# Patient Record
Sex: Male | Born: 1995 | Race: Black or African American | Hispanic: No | Marital: Single | Smoking: Current some day smoker
Health system: Southern US, Community
[De-identification: ages and names within clinical notes are randomized; demographics above are authoritative.]

## PROBLEM LIST (undated history)

## (undated) DIAGNOSIS — J45909 Unspecified asthma, uncomplicated: Secondary | ICD-10-CM

---
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
## 2022-11-16 ENCOUNTER — Other Ambulatory Visit: Payer: Self-pay

## 2022-11-16 ENCOUNTER — Emergency Department (HOSPITAL_BASED_OUTPATIENT_CLINIC_OR_DEPARTMENT_OTHER)
Admission: EM | Admit: 2022-11-16 | Discharge: 2022-11-16 | Disposition: A | Discharge status: Home / Self Care | Payer: BLUE CROSS/BLUE SHIELD | Attending: Emergency Medicine | Admitting: Emergency Medicine

## 2022-11-16 ENCOUNTER — Encounter (HOSPITAL_BASED_OUTPATIENT_CLINIC_OR_DEPARTMENT_OTHER): Payer: Self-pay

## 2022-11-16 DIAGNOSIS — L02212 Cutaneous abscess of back [any part, except buttock]: Secondary | ICD-10-CM | POA: Insufficient documentation

## 2022-11-16 DIAGNOSIS — J45909 Unspecified asthma, uncomplicated: Secondary | ICD-10-CM | POA: Insufficient documentation

## 2022-11-16 DIAGNOSIS — Z7982 Long term (current) use of aspirin: Secondary | ICD-10-CM | POA: Insufficient documentation

## 2022-11-16 DIAGNOSIS — L0291 Cutaneous abscess, unspecified: Secondary | ICD-10-CM

## 2022-11-16 MED ORDER — NAPROXEN 500 MG PO TABS: 500.0000 mg | ORAL_TABLET | Freq: Two times a day (BID) | ORAL | 0 refills | Status: AC

## 2022-11-16 MED ORDER — DOXYCYCLINE HYCLATE 100 MG PO CAPS: 100.0000 mg | ORAL_CAPSULE | Freq: Two times a day (BID) | ORAL | 0 refills | Status: AC

## 2022-11-16 MED ORDER — LIDOCAINE-EPINEPHRINE (PF) 2 %-1:200000 IJ SOLN
20.0000 mL | Freq: Once | INTRAMUSCULAR | Status: AC
  Administered 2022-11-16: 20 mL
  Filled 2022-11-16: qty 20

## 2022-11-16 NOTE — ED Provider Notes (Signed)
[facility] [facility] Provider Note   CSN: [id number] Arrival date & time: 11/16/22  1347     History  Chief Complaint  Patient presents with   Abscess    Joshua Conley is a 27 y.o. male with a past medical history of asthma presents today for evaluation of an abscess.  Patient noted an abscess on his left middle back about 2 days ago.  He noticed some stringy drainage coming out of the abscess.  States the abscess has increased in size and pain in the last 2 days.  States he has never had any abscess or cyst in the same location before.  Has not tried any medication for pain at home.  Denies any fever, chest pain, shortness of breath, nausea, vomiting, rash.   Abscess     Past Medical History:  Diagnosis Date   Asthma    Past Surgical History:  Procedure Laterality Date   IRRIGATION AND DEBRIDEMENT KNEE Left 05/14/2014   Procedure: IRRIGATION AND DEBRIDEMENT of LEFT OPEN PATELLA  FRACTURE;  Surgeon: Kathryne Hitch, MD;  Location: [facility] OR;  Service: Orthopedics;  Laterality: Left;   PATELLA FRACTURE SURGERY Left 05/14/2014   DR [facility]     Home Medications Prior to Admission medications   Medication Sig Start Date End Date Taking? Authorizing Provider  aspirin EC 325 MG tablet Take 1 tablet (325 mg total) by mouth daily. 05/17/14   Freeman Caldron, PA-C  oxyCODONE-acetaminophen (PERCOCET) 7.5-325 MG per tablet Take 1-2 tablets by mouth every 4 (four) hours as needed for pain. 05/17/14   Freeman Caldron, PA-C      Allergies    Peanuts [peanut oil]    Review of Systems   Review of Systems Negative except as per HPI.  Physical Exam Updated Vital Signs BP 119/70 (BP Location: Right Arm)   Pulse 80   Temp 98.7 F (37.1 C) (Oral)   Resp 18   Ht  (1.778 m)   Wt 65.8 kg   SpO2 97%   BMI 20.81 kg/m  Physical Exam Vitals and nursing note reviewed.  Constitutional:      Appearance: Normal appearance.   HENT:     Head: Normocephalic and atraumatic.     Mouth/Throat:     Mouth: Mucous membranes are moist.  Eyes:     General: No scleral icterus. Cardiovascular:     Rate and Rhythm: Normal rate and regular rhythm.     Pulses: Normal pulses.     Heart sounds: Normal heart sounds.  Pulmonary:     Effort: Pulmonary effort is normal.     Breath sounds: Normal breath sounds.  Abdominal:     General: Abdomen is flat.     Palpations: Abdomen is soft.     Tenderness: There is no abdominal tenderness.  Musculoskeletal:        General: No deformity.  Skin:    General: Skin is warm.     Findings: No rash.     Comments: 2 x 2 centimeter fluctuant mass on the left middle back.  There is a small opening on the top of this skin lesion.  No active bleeding or drainage noted.  Neurological:     General: No focal deficit present.     Mental Status: He is alert.  Psychiatric:        Mood and Affect: Mood normal.     ED Results / Procedures / Treatments   Labs (all  labs ordered are listed, but only abnormal results are displayed) Labs Reviewed - No data to display  EKG None  Radiology No results found.  Procedures .Marland KitchenIncision and Drainage  Date/Time: 11/16/2022 4:32 PM  Performed by: Jeanelle Malling, PA Authorized by: Jeanelle Malling, PA   Consent:    Consent obtained:  Verbal   Consent given by:  Patient   Risks discussed:  Bleeding, incomplete drainage, pain and damage to other organs   Alternatives discussed:  No treatment Universal protocol:    Procedure explained and questions answered to patient or proxy's satisfaction: yes     Relevant documents present and verified: yes     Test results available : yes     Imaging studies available: yes     Required blood products, implants, devices, and special equipment available: yes     Site/side marked: yes     Immediately prior to procedure, a time out was called: yes     Patient identity confirmed:  Verbally with patient Location:    Type:   Abscess   Location: Left sided middle back. Pre-procedure details:    Skin preparation:  Betadine Sedation:    Sedation type:  None Anesthesia:    Anesthesia method:  Local infiltration   Local anesthetic:  Lidocaine 1% w/o epi Procedure type:    Complexity:  Complex Procedure details:    Incision types:  Single straight   Incision depth:  Subcutaneous   Wound management:  Probed and deloculated, irrigated with saline and extensive cleaning   Drainage:  Purulent   Drainage amount:  Moderate   Packing materials:  1/4 in gauze   Amount 1/4":  3 cm Post-procedure details:    Procedure completion:  Tolerated well, no immediate complications     Medications Ordered in ED Medications  lidocaine-EPINEPHrine (XYLOCAINE W/EPI) 2 %-1:200000 (PF) injection 20 mL (has no administration in time range)    ED Course/ Medical Decision Making/ A&P                             Medical Decision Making Risk Prescription drug management.   This patient presents to the ED for abscess, this involves an extensive number of treatment options, and is a complaint that carries with a high risk of complications and morbidity.  The differential diagnosis includes abscess, cyst, cellulitis.  This is not an exhaustive list.  Problem list/ ED course/ Critical interventions/ Medical management: HPI: See above Vital signs within normal range and stable throughout visit. Laboratory/imaging studies significant for: See above. On physical examination, patient is afebrile and appears in no acute distress. This patient presents with a painful fluid pocket with fluctuance and surrounding induration and erythema, concerning for an abscess of the middle back. The abscess was anesthetized with lidocaine and then I&D was performed with deloculation and purulence was expressed. There is no lymphangitic spread visible. Low concern for osteomyelitis. Patient is not immunocompromised, and there is no bullae, pain out of  proportion, or rapid progression concerning for necrotizing fasciitis. Patient to be discharged home with doxy with follow up with their PCP.  I have reviewed the patient home medicines and have made adjustments as needed.  Cardiac monitoring/EKG: The patient was maintained on a cardiac monitor.  I personally reviewed and interpreted the cardiac monitor which showed an underlying rhythm of: sinus rhythm.  Additional history obtained: External records from outside source obtained and reviewed including: Chart review including previous notes, labs,  imaging.  Consultations obtained:  Disposition Continued outpatient therapy. Follow-up with PCP recommended for reevaluation of symptoms. Treatment plan discussed with patient.  Pt acknowledged understanding was agreeable to the plan. Worrisome signs and symptoms were discussed with patient, and patient acknowledged understanding to return to the ED if they noticed these signs and symptoms. Patient was stable upon discharge.   This chart was dictated using voice recognition software.  Despite best efforts to proofread,  errors can occur which can change the documentation meaning.          Final Clinical Impression(s) / ED Diagnoses Final diagnoses:  Abscess    Rx / DC Orders ED Discharge Orders          Ordered    doxycycline (VIBRAMYCIN) 100 MG capsule  2 times daily        11/16/22 1630    naproxen (NAPROSYN) 500 MG tablet  2 times daily        11/16/22 1630              Jeanelle Malling, [place] 11/16/22 1633    Melene Plan, DO 11/17/22 [phone]

## 2022-11-16 NOTE — Discharge Instructions (Addendum)
Please take your antibiotics as prescribed. Take tylenol/ibuprofen for pain. I recommend close follow-up with PCP for reevaluation.  Please do not hesitate to return to emergency department if worrisome signs symptoms we discussed become apparent.  

## 2022-11-16 NOTE — ED Triage Notes (Signed)
States has a bump to his left upper back, swelling and redness noted. States popped it the other day and had some "stringy drainage". Has increased in size the past few days.

## 2022-11-16 NOTE — ED Notes (Signed)
ED Provider at bedside.
